# Patient Record
Sex: Female | Born: 1990 | Race: Black or African American | Hispanic: No | Marital: Single | Smoking: Never smoker
Health system: Southern US, Community
[De-identification: ages and names within clinical notes are randomized; demographics above are authoritative.]

## PROBLEM LIST (undated history)

## (undated) DIAGNOSIS — Z87898 Personal history of other specified conditions: Secondary | ICD-10-CM

## (undated) DIAGNOSIS — D649 Anemia, unspecified: Secondary | ICD-10-CM

## (undated) DIAGNOSIS — S0990XA Unspecified injury of head, initial encounter: Secondary | ICD-10-CM

## (undated) HISTORY — PX: FINGER SURGERY: SHX640

## (undated) HISTORY — DX: Anemia, unspecified: D64.9

## (undated) HISTORY — PX: FRACTURE SURGERY: SHX138

## (undated) HISTORY — PX: ACHILLES TENDON SURGERY: SHX542

## (undated) HISTORY — DX: Personal history of other specified conditions: Z87.898

---

## 2012-01-02 ENCOUNTER — Encounter (INDEPENDENT_AMBULATORY_CARE_PROVIDER_SITE_OTHER): Payer: 59 | Admitting: Obstetrics and Gynecology

## 2012-01-02 DIAGNOSIS — Z01419 Encounter for gynecological examination (general) (routine) without abnormal findings: Secondary | ICD-10-CM

## 2012-01-02 DIAGNOSIS — Z202 Contact with and (suspected) exposure to infections with a predominantly sexual mode of transmission: Secondary | ICD-10-CM

## 2012-01-02 DIAGNOSIS — R5383 Other fatigue: Secondary | ICD-10-CM

## 2012-01-29 ENCOUNTER — Encounter: Payer: Self-pay | Admitting: Obstetrics and Gynecology

## 2012-01-29 ENCOUNTER — Ambulatory Visit (INDEPENDENT_AMBULATORY_CARE_PROVIDER_SITE_OTHER): Payer: Self-pay | Admitting: Obstetrics and Gynecology

## 2012-01-29 VITALS — BP 102/70 | HR 82 | Wt 182.0 lb

## 2012-01-29 DIAGNOSIS — R5383 Other fatigue: Secondary | ICD-10-CM | POA: Insufficient documentation

## 2012-01-29 DIAGNOSIS — A749 Chlamydial infection, unspecified: Secondary | ICD-10-CM | POA: Insufficient documentation

## 2012-01-29 DIAGNOSIS — Z202 Contact with and (suspected) exposure to infections with a predominantly sexual mode of transmission: Secondary | ICD-10-CM

## 2012-01-29 DIAGNOSIS — Z113 Encounter for screening for infections with a predominantly sexual mode of transmission: Secondary | ICD-10-CM

## 2012-01-29 DIAGNOSIS — A7489 Other chlamydial diseases: Secondary | ICD-10-CM

## 2012-01-29 DIAGNOSIS — D649 Anemia, unspecified: Secondary | ICD-10-CM | POA: Insufficient documentation

## 2012-01-29 NOTE — Patient Instructions (Signed)
Patient to be given a note for school
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
## 2012-01-30 LAB — GC/CHLAMYDIA PROBE AMP, GENITAL
Chlamydia, DNA Probe: NEGATIVE
GC Probe Amp, Genital: NEGATIVE

## 2012-08-09 ENCOUNTER — Encounter (HOSPITAL_COMMUNITY): Payer: Self-pay | Admitting: Emergency Medicine

## 2012-08-09 ENCOUNTER — Emergency Department (HOSPITAL_COMMUNITY)
Admission: EM | Admit: 2012-08-09 | Discharge: 2012-08-09 | Disposition: A | Discharge status: Home / Self Care | Payer: BC Managed Care – PPO | Source: Home / Self Care | Attending: Family Medicine | Admitting: Family Medicine

## 2012-08-09 ENCOUNTER — Telehealth (HOSPITAL_COMMUNITY): Payer: Self-pay | Admitting: Family Medicine

## 2012-08-09 DIAGNOSIS — H6092 Unspecified otitis externa, left ear: Secondary | ICD-10-CM

## 2012-08-09 DIAGNOSIS — J329 Chronic sinusitis, unspecified: Secondary | ICD-10-CM

## 2012-08-09 DIAGNOSIS — H60399 Other infective otitis externa, unspecified ear: Secondary | ICD-10-CM

## 2012-08-09 MED ORDER — CIPROFLOXACIN-DEXAMETHASONE 0.3-0.1 % OT SUSP: 4.0000 [drp] | Freq: Two times a day (BID) | OTIC | Status: DC

## 2012-08-09 MED ORDER — AMOXICILLIN 500 MG PO CAPS: 500.0000 mg | ORAL_CAPSULE | Freq: Three times a day (TID) | ORAL | Status: DC

## 2012-08-09 MED ORDER — PREDNISONE 20 MG PO TABS: ORAL_TABLET | ORAL | Status: DC

## 2012-08-09 NOTE — ED Notes (Addendum)
Pt c/o sore throat, productive cough with yellow sputum  And sinus drainage. X 1wk   Pt states on Thursday and Friday she had a fever ranging from 101-102.  Having pain in left ear with drainage. That started yesterday.  Pt states that she has felt some dizziness. Pt denies n/v/d.  Pt has used otc cold meds some relief in with throat pain.

## 2012-08-09 NOTE — ED Notes (Signed)
Pharmacy called stating Ciprodex was $100 with insurance.  Dr Tressia Danas reviewed chart and RX was changed to Cortisporin Orit solution 4 drops left ear three times a day for 10 days

## 2012-08-10 NOTE — ED Notes (Signed)
Dr  Payton Mccallum  Another  Day  Off  From  School

## 2012-08-10 NOTE — ED Notes (Signed)
Pt            Called  Still  Has  Fever   And  Earache   Would  Like    School  Extension   for another  Day

## 2012-08-11 NOTE — ED Provider Notes (Signed)
History     CSN: [id number]  Arrival date & time 08/09/12  0915   First MD Initiated Contact with Patient 08/09/12 [phone]      Chief Complaint  Patient presents with  . URI    pt c/o productive cough yellow sputum, sinus drainage, fever thurs and fri 101-102  . Otalgia    left ear pain and drainage. pain started 10/26    (Consider location/radiation/quality/duration/timing/severity/associated sxs/prior treatment) HPI Comments: 21 y/o female here c/o sore throat, productive cough with yellow sputum and sinus drainage for over 1 week. Has felt warm and had a temp 102 two days ago and dizziness. No fever yesterday o today. Started with drainage and pain in her left ear yesterday. Throat pain improved now. Denies nausea or vomiting, no abdominal pain. No chest pain or shortness of breath.   Past Medical History  Diagnosis Date  . Anemia   . H/O fatigue     Past Surgical History  Procedure Date  . Fracture surgery   . Finger surgery   . Achilles tendon surgery     Family History  Problem Relation Age of Onset  . Diabetes Mother   . Allergy (severe) Mother   . Obesity Mother   . Heart disease Maternal Aunt   . Diabetes Maternal Aunt   . Obesity Maternal Aunt   . Heart disease Maternal Uncle   . Diabetes Maternal Uncle   . Diabetes Maternal Grandmother   . Heart disease Maternal Grandmother   . Obesity Maternal Grandmother   . Diabetes Maternal Grandfather   . Heart disease Maternal Grandfather   . Cancer Maternal Grandfather     STOMACH CANCER    History  Substance Use Topics  . Smoking status: Never Smoker   . Smokeless tobacco: Not on file   Comment: HOOKAH  . Alcohol Use: Yes     SOMETIMES    OB History    Grav Para Term Preterm Abortions TAB SAB Ect Mult Living   0         0      Review of Systems  Constitutional: Positive for fever. Negative for appetite change.  HENT: Positive for ear pain, congestion, sore throat, rhinorrhea, sinus pressure and  ear discharge. Negative for facial swelling, trouble swallowing and neck pain.   Eyes: Negative for discharge and visual disturbance.  Respiratory: Positive for cough. Negative for shortness of breath.   Cardiovascular: Negative for chest pain.  Gastrointestinal: Negative for nausea, vomiting and abdominal pain.  Musculoskeletal: Negative for myalgias and arthralgias.  Skin: Negative for rash.  Neurological: Positive for dizziness. Negative for headaches.  All other systems reviewed and are negative.    Allergies  Mold extract  Home Medications   Current Outpatient Rx  Name Route Sig Dispense Refill  . AMOXICILLIN 500 MG PO CAPS Oral Take 1 capsule (500 mg total) by mouth 3 (three) times daily. 21 capsule 0  . CIPROFLOXACIN-DEXAMETHASONE 0.3-0.1 % OT SUSP Left Ear Place 4 drops into the left ear 2 (two) times daily. 7.5 mL 0  . FLUTICASONE FUROATE 27.5 MCG/SPRAY NA SUSP Nasal Place 2 sprays into the nose daily.    Marland Kitchen LEVOCETIRIZINE DIHYDROCHLORIDE 5 MG PO TABS Oral Take 5 mg by mouth every evening.    Marland Kitchen LOESTRIN 24 FE PO Oral Take by mouth.    Marland Kitchen PREDNISONE 20 MG PO TABS  2 tabs by mouth daily for 5 days 10 tablet 0    BP 128/75  Pulse 86  Temp 98.7 F (37.1 C) (Oral)  Resp 18  SpO2 100%  LMP 07/30/2012  Physical Exam  Nursing note reviewed. Constitutional: She is oriented to person, place, and time. She appears well-developed and well-nourished. No distress.  HENT:  Head: Normocephalic and atraumatic.       Nasal Congestion with erythema and swelling of nasal turbinates, clear rhinorrhea. pharyngeal erythema no exudates. No uvula deviation. No trismus. Left ear canal with mild swelling and white adherent exudates but still patent and able to see membrane. Does not appear perforated. TM's with increased vascular markings and some dullness bilaterally no swelling or bulging.   Cardiovascular: Normal rate, regular rhythm and normal heart sounds.   Pulmonary/Chest: Effort  normal and breath sounds normal. No respiratory distress. She has no wheezes. She has no rales. She exhibits no tenderness.  Neurological: She is alert and oriented to person, place, and time.  Skin: No rash noted.    ED Course  Procedures (including critical care time)   Labs Reviewed  POCT RAPID STREP A ([facility] ONLY)  LAB REPORT - SCANNED   No results found.   1. Sinusitis   2. External otitis of left ear       MDM  Treated with amoxicillin, Ciprodex and prednisone. Red flags that should prompt her return to medical attention discussed with patient and provided in writing.        Sharin Grave, MD 08/11/12 [phone]

## 2013-08-08 ENCOUNTER — Emergency Department (HOSPITAL_COMMUNITY): Payer: No Typology Code available for payment source

## 2013-08-08 ENCOUNTER — Encounter (HOSPITAL_COMMUNITY): Payer: Self-pay | Admitting: Emergency Medicine

## 2013-08-08 ENCOUNTER — Emergency Department (HOSPITAL_COMMUNITY)
Admission: EM | Admit: 2013-08-08 | Discharge: 2013-08-08 | Disposition: A | Discharge status: Home / Self Care | Payer: No Typology Code available for payment source | Attending: Emergency Medicine | Admitting: Emergency Medicine

## 2013-08-08 DIAGNOSIS — M62838 Other muscle spasm: Secondary | ICD-10-CM | POA: Insufficient documentation

## 2013-08-08 DIAGNOSIS — S0990XA Unspecified injury of head, initial encounter: Secondary | ICD-10-CM | POA: Insufficient documentation

## 2013-08-08 DIAGNOSIS — Y9389 Activity, other specified: Secondary | ICD-10-CM | POA: Insufficient documentation

## 2013-08-08 DIAGNOSIS — Z862 Personal history of diseases of the blood and blood-forming organs and certain disorders involving the immune mechanism: Secondary | ICD-10-CM | POA: Insufficient documentation

## 2013-08-08 DIAGNOSIS — Y92410 Unspecified street and highway as the place of occurrence of the external cause: Secondary | ICD-10-CM | POA: Insufficient documentation

## 2013-08-08 MED ORDER — METHOCARBAMOL 500 MG PO TABS: 500.0000 mg | ORAL_TABLET | Freq: Two times a day (BID) | ORAL | Status: DC

## 2013-08-08 MED ORDER — METHOCARBAMOL 500 MG PO TABS
500.0000 mg | ORAL_TABLET | Freq: Three times a day (TID) | ORAL | Status: DC
  Administered 2013-08-08: 500 mg via ORAL
  Filled 2013-08-08: qty 1

## 2013-08-08 MED ORDER — HYDROCODONE-ACETAMINOPHEN 5-325 MG PO TABS: 2.0000 | ORAL_TABLET | ORAL | Status: DC | PRN

## 2013-08-08 MED ORDER — HYDROCODONE-ACETAMINOPHEN 5-325 MG PO TABS
2.0000 | ORAL_TABLET | Freq: Once | ORAL | Status: AC
  Administered 2013-08-08: 2 via ORAL
  Filled 2013-08-08: qty 2

## 2013-08-08 NOTE — ED Notes (Signed)
Pt says c color.
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
## 2013-08-10 ENCOUNTER — Emergency Department (HOSPITAL_COMMUNITY): Payer: No Typology Code available for payment source

## 2013-08-10 ENCOUNTER — Encounter (HOSPITAL_COMMUNITY): Payer: Self-pay | Admitting: Emergency Medicine

## 2013-08-10 ENCOUNTER — Emergency Department (INDEPENDENT_AMBULATORY_CARE_PROVIDER_SITE_OTHER)
Admission: EM | Admit: 2013-08-10 | Discharge: 2013-08-10 | Disposition: A | Discharge status: Nursing Home (Includes ICF) | Payer: Self-pay | Source: Home / Self Care | Attending: Family Medicine | Admitting: Family Medicine

## 2013-08-10 DIAGNOSIS — S060X0A Concussion without loss of consciousness, initial encounter: Secondary | ICD-10-CM

## 2013-08-10 DIAGNOSIS — Z79899 Other long term (current) drug therapy: Secondary | ICD-10-CM | POA: Insufficient documentation

## 2013-08-10 DIAGNOSIS — Z3202 Encounter for pregnancy test, result negative: Secondary | ICD-10-CM | POA: Insufficient documentation

## 2013-08-10 DIAGNOSIS — R55 Syncope and collapse: Secondary | ICD-10-CM | POA: Insufficient documentation

## 2013-08-10 DIAGNOSIS — Z862 Personal history of diseases of the blood and blood-forming organs and certain disorders involving the immune mechanism: Secondary | ICD-10-CM | POA: Insufficient documentation

## 2013-08-10 DIAGNOSIS — R110 Nausea: Secondary | ICD-10-CM | POA: Insufficient documentation

## 2013-08-10 DIAGNOSIS — H538 Other visual disturbances: Secondary | ICD-10-CM | POA: Insufficient documentation

## 2013-08-10 DIAGNOSIS — Z87820 Personal history of traumatic brain injury: Secondary | ICD-10-CM | POA: Insufficient documentation

## 2013-08-10 DIAGNOSIS — R404 Transient alteration of awareness: Secondary | ICD-10-CM

## 2013-08-10 DIAGNOSIS — Z8679 Personal history of other diseases of the circulatory system: Secondary | ICD-10-CM | POA: Insufficient documentation

## 2013-08-10 DIAGNOSIS — R4020 Unspecified coma: Secondary | ICD-10-CM

## 2013-08-10 DIAGNOSIS — H53149 Visual discomfort, unspecified: Secondary | ICD-10-CM | POA: Insufficient documentation

## 2013-08-10 DIAGNOSIS — R51 Headache: Secondary | ICD-10-CM | POA: Insufficient documentation

## 2013-08-10 HISTORY — DX: Unspecified injury of head, initial encounter: S09.90XA

## 2013-08-10 LAB — POCT PREGNANCY, URINE: Preg Test, Ur: NEGATIVE
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
## 2013-08-11 ENCOUNTER — Emergency Department (HOSPITAL_COMMUNITY)
Admission: EM | Admit: 2013-08-11 | Discharge: 2013-08-11 | Disposition: A | Discharge status: Home / Self Care | Payer: No Typology Code available for payment source | Attending: Emergency Medicine | Admitting: Emergency Medicine

## 2013-08-11 ENCOUNTER — Emergency Department (HOSPITAL_COMMUNITY): Payer: No Typology Code available for payment source

## 2013-08-11 DIAGNOSIS — R55 Syncope and collapse: Secondary | ICD-10-CM

## 2013-08-11 LAB — CBC WITH DIFFERENTIAL/PLATELET
Basophils Relative: 1 % (ref 0–1)
Eosinophils Absolute: 0.1 10*3/uL (ref 0.0–0.7)
Eosinophils Relative: 2 % (ref 0–5)
Hemoglobin: 14.2 g/dL (ref 12.0–15.0)
Lymphocytes Relative: 46 % (ref 12–46)
Lymphs Abs: 3.1 10*3/uL (ref 0.7–4.0)
MCV: 84.5 fL (ref 78.0–100.0)
Monocytes Absolute: 0.4 10*3/uL (ref 0.1–1.0)
Monocytes Relative: 6 % (ref 3–12)
Neutro Abs: 3 10*3/uL (ref 1.7–7.7)
Neutrophils Relative %: 45 % (ref 43–77)
Platelets: 340 10*3/uL (ref 150–400)
RBC: 4.59 MIL/uL (ref 3.87–5.11)
WBC: 6.6 10*3/uL (ref 4.0–10.5)

## 2013-08-11 LAB — BASIC METABOLIC PANEL
CO2: 23 mEq/L (ref 19–32)
Calcium: 9.1 mg/dL (ref 8.4–10.5)
Chloride: 103 mEq/L (ref 96–112)
GFR calc non Af Amer: >90 mL/min (ref >90)
Glucose, Bld: 111 mg/dL — ABNORMAL HIGH (ref 70–99)
Potassium: 3.5 mEq/L (ref 3.5–5.1)
Sodium: 139 mEq/L (ref 135–145)

## 2013-08-11 LAB — URINALYSIS, ROUTINE W REFLEX MICROSCOPIC
Bilirubin Urine: NEGATIVE
Glucose, UA: NEGATIVE mg/dL
Ketones, ur: NEGATIVE mg/dL
Nitrite: NEGATIVE
Specific Gravity, Urine: 1.029 (ref 1.005–1.030)
pH: 6 (ref 5.0–8.0)

## 2013-08-11 LAB — URINE MICROSCOPIC-ADD ON

## 2013-08-11 LAB — RAPID URINE DRUG SCREEN, HOSP PERFORMED
Amphetamines: NOT DETECTED
Barbiturates: NOT DETECTED
Benzodiazepines: NOT DETECTED
Cocaine: NOT DETECTED

## 2013-08-11 MED ORDER — IBUPROFEN 800 MG PO TABS: 800.0000 mg | ORAL_TABLET | Freq: Three times a day (TID) | ORAL | Status: DC

## 2013-08-11 MED ORDER — KETOROLAC TROMETHAMINE 60 MG/2ML IM SOLN
60.0000 mg | Freq: Once | INTRAMUSCULAR | Status: AC
  Administered 2013-08-11: 60 mg via INTRAMUSCULAR
  Filled 2013-08-11: qty 2

## 2013-08-11 NOTE — ED Notes (Signed)
No changes, pt alert, NAD, calm, interactive, speech clear.

## 2013-08-11 NOTE — ED Notes (Signed)
Pt/family updated, plan explained.

## 2013-08-11 NOTE — ED Notes (Signed)
Pt resting/ sleeping, NAD, calm.  

## 2013-08-11 NOTE — ED Notes (Addendum)
Family has left BS, no changes, pt resting/ sleeping. Pt to have MRI in am.

## 2013-08-11 NOTE — ED Notes (Signed)
Pt reports HA went away completely after med at [facility], gradually returned. When asked about the last syncopal episode she stated, "she had one at [facility], had one in triage here and had one in w/r". When asked ho long they last, parent states, "we have to wake her up". Pt states, "they don't let me sleep". Reports sensitivity to light.
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
## 2013-08-11 NOTE — ED Notes (Signed)
PA at bedside.

## 2013-08-11 NOTE — ED Provider Notes (Signed)
Medical screening examination/treatment/procedure(s) were performed by non-physician practitioner and as supervising physician I was immediately available for consultation/collaboration.  EKG Interpretation   None        Geoffery Lyons, MD 08/11/13 [phone]

## 2013-08-11 NOTE — ED Notes (Signed)
ED PA at [facility] 

## 2013-08-11 NOTE — ED Notes (Signed)
Pt alert, NAD, calm, interactive, resps e/u, speaking in clear complete sentences, steady gait, ambulatory back to room, family x2 at [facility], reports med given at [facility] for HA is wearing off, HA returning.

## 2013-08-11 NOTE — ED Notes (Signed)
Pt arousable to entering room, alert, NAD, calm, VSS, pt to MRI.

## 2013-08-11 NOTE — ED Notes (Signed)
Pt back from MRI, alert, NAD, calm, interactive, no changes, VSS. pending results.

## 2013-08-12 ENCOUNTER — Encounter: Payer: Self-pay | Admitting: Neurology

## 2013-08-12 ENCOUNTER — Ambulatory Visit (INDEPENDENT_AMBULATORY_CARE_PROVIDER_SITE_OTHER): Payer: No Typology Code available for payment source | Admitting: Neurology

## 2013-08-12 VITALS — BP 117/68 | HR 85 | Temp 98.5°F | Ht 66.0 in | Wt 197.0 lb

## 2013-08-12 DIAGNOSIS — R404 Transient alteration of awareness: Secondary | ICD-10-CM

## 2013-08-12 DIAGNOSIS — R400 Somnolence: Secondary | ICD-10-CM

## 2013-08-12 DIAGNOSIS — R6889 Other general symptoms and signs: Secondary | ICD-10-CM

## 2013-08-12 NOTE — ED Provider Notes (Signed)
Medical screening examination/treatment/procedure(s) were performed by non-physician practitioner and as supervising physician I was immediately available for consultation/collaboration.    Vida Roller, MD 08/12/13 [phone]

## 2013-08-12 NOTE — Patient Instructions (Addendum)
Please remember, common headache triggers are: sleep deprivation, dehydration, overheating, stress, hypoglycemia or skipping meals and blood sugar fluctuations, excessive pain medications or excessive alcohol use or caffeine withdrawal. Some people have food triggers such as aged cheese, orange juice or chocolate, especially dark chocolate, or MSG (monosodium glutamate). Try to avoid these headache triggers as much possible. It may be helpful to keep a headache diary to figure out what makes your headaches worse or brings them on and what alleviates them. Some people report headache onset after exercise but studies have shown that regular exercise may actually prevent headaches from coming. If you have exercise-induced headaches, please make sure that you drink plenty of fluid before and after exercising and that you do not over do it and do not overheat.  Reduce and stop robaxin and reduce Ibuprofen, try not to take pain killers daily. Stop all nicotine and illicit drug products.   We will do further testing with EEG, sleep studies. No new medications.
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
## 2013-08-13 LAB — VITAMIN D 25 HYDROXY (VIT D DEFICIENCY, FRACTURES): Vit D, 25-Hydroxy: 12.5 ng/mL — ABNORMAL LOW (ref 30.0–100.0)

## 2013-08-13 LAB — FERRITIN: Ferritin: 111 ng/mL (ref 15–150)

## 2013-08-13 LAB — TSH: TSH: 0.474 u[IU]/mL (ref 0.450–4.500)

## 2013-08-16 ENCOUNTER — Ambulatory Visit (INDEPENDENT_AMBULATORY_CARE_PROVIDER_SITE_OTHER): Payer: No Typology Code available for payment source | Admitting: Radiology

## 2013-08-16 DIAGNOSIS — R6889 Other general symptoms and signs: Secondary | ICD-10-CM

## 2013-08-16 DIAGNOSIS — R400 Somnolence: Secondary | ICD-10-CM

## 2013-08-16 NOTE — Progress Notes (Signed)
Quick Note:  Please call and advise the patient that the recent labs we checked were within normal limits, with the exception of low vitamin D level, for which I would recommend that she start an over-the-counter vitamin D supplement. She can also discuss with her primary care physician vitamin D supplementation by prescription strength. Otherwise, she had a normal vitamin B12 level, normal iron, normal thyroid function, and we also checked and autoimmune marker which was negative. No other action is required on these tests at this time, other than Vit D supplementation. Please remind patient to keep any upcoming appointments or tests and to call us with any interim questions, concerns, problems or updates. Thanks,  Huston Foley, MD, PhD    ______

## 2013-08-17 NOTE — Progress Notes (Signed)
Quick Note:  Called ,no answer,left VM message for return call back for lab results ______

## 2013-08-17 NOTE — Progress Notes (Signed)
Quick Note:  I called and relayed the results of her labs (normal) except low vit D level. She does not have pcp at this time. She asked if she could call back re: to this. I said sure. ______

## 2013-08-19 ENCOUNTER — Ambulatory Visit (INDEPENDENT_AMBULATORY_CARE_PROVIDER_SITE_OTHER): Payer: No Typology Code available for payment source

## 2013-08-19 DIAGNOSIS — G4710 Hypersomnia, unspecified: Secondary | ICD-10-CM

## 2013-08-19 DIAGNOSIS — R6889 Other general symptoms and signs: Secondary | ICD-10-CM

## 2013-08-19 DIAGNOSIS — G4733 Obstructive sleep apnea (adult) (pediatric): Secondary | ICD-10-CM

## 2013-08-19 DIAGNOSIS — R400 Somnolence: Secondary | ICD-10-CM

## 2013-08-30 ENCOUNTER — Telehealth: Payer: Self-pay | Admitting: Neurology

## 2013-08-31 NOTE — Telephone Encounter (Signed)
Spoke to patient and explained that sleep study not yet read, but it was moved to top of the list and I expect it should be read tomorrow.  I will call her after it is read to give her those results.  She also asked about EEG which I can't see those results in the system, told her I wouldn't be able to give her any of that information.  She asks for the following: 1.  Letter for the GRE she is scheduled to take.  The letter needs to include her confirmation number which is #[id number], it needs to state that she is unable to take the GRE at this time due to concussion symptoms and the inability to concentrate.  This letter needs to be faxed to [phone].  She requests a phone call once it has been faxed so she knows this has gone through.  2.  Letter for school ASAP stating that she is cleared to attend classes, it needs to list her health status and mention her ability to focus in class.  She can pick this up if she is cleared to drive, otherwise, she will need to have it mailed to her.  3.  She also wonders if she can drive?

## 2013-08-31 NOTE — Telephone Encounter (Signed)
Please advise 

## 2013-09-01 ENCOUNTER — Telehealth: Payer: Self-pay | Admitting: Neurology

## 2013-09-01 ENCOUNTER — Telehealth: Payer: Self-pay | Admitting: *Deleted

## 2013-09-01 ENCOUNTER — Encounter: Payer: Self-pay | Admitting: *Deleted

## 2013-09-01 DIAGNOSIS — G4733 Obstructive sleep apnea (adult) (pediatric): Secondary | ICD-10-CM

## 2013-09-01 NOTE — Telephone Encounter (Signed)
Please call and notify the patient that the recent sleep study did confirm the diagnosis of obstructive sleep apnea and that I recommend treatment for this in the form of CPAP. This will require a repeat sleep study for proper titration and mask fitting. Please explain to patient and arrange for a CPAP titration study. I have placed an order in the chart. Thanks, Theo Krumholz, MD, PhD [facility] ([facility]  

## 2013-09-01 NOTE — Telephone Encounter (Signed)
No evidence of narcolepsy, but EEG has not been interpreted yet. I would like to hold off on driving until her EEG is reported. I will try to expedite. At this point, please ask patient to let someone drive her. thx

## 2013-09-01 NOTE — Telephone Encounter (Signed)
Spoke with patient and she said that Dr Frances Furbish is suppose to give her clearance to drive because of narcolepsy, ER doctor told her that she could not drive.

## 2013-09-01 NOTE — Telephone Encounter (Signed)
I called and spoke with the patient concerning her sleep study results. I informed the patient that her study revealed the diagnosis of obstructive sleep apnea and Dr. Frances Furbish is recommending CPAP Titration study. Patient stated she wants to hold off on the study because she knows its her tonsils that is causing the obstructive sleep and she wants to deal with her headaches.   I spoke with Dr. Frances Furbish about the patient concerning and she stated to schedule a follow visit. Patient is schedule for 09-02-13

## 2013-09-01 NOTE — Telephone Encounter (Signed)
Pt was contacted 09/01/13 by Jasmine December with sleep study results.  See separate telephone encounter for additional details.

## 2013-09-02 ENCOUNTER — Encounter: Payer: Self-pay | Admitting: Neurology

## 2013-09-02 ENCOUNTER — Ambulatory Visit (INDEPENDENT_AMBULATORY_CARE_PROVIDER_SITE_OTHER): Payer: No Typology Code available for payment source | Admitting: Neurology

## 2013-09-02 VITALS — BP 130/80 | HR 86 | Ht 66.0 in | Wt 198.0 lb

## 2013-09-02 DIAGNOSIS — R930 Abnormal findings on diagnostic imaging of skull and head, not elsewhere classified: Secondary | ICD-10-CM

## 2013-09-02 DIAGNOSIS — R51 Headache: Secondary | ICD-10-CM

## 2013-09-02 DIAGNOSIS — G4733 Obstructive sleep apnea (adult) (pediatric): Secondary | ICD-10-CM

## 2013-09-02 DIAGNOSIS — R9089 Other abnormal findings on diagnostic imaging of central nervous system: Secondary | ICD-10-CM

## 2013-09-02 DIAGNOSIS — Z87820 Personal history of traumatic brain injury: Secondary | ICD-10-CM

## 2013-09-02 NOTE — Telephone Encounter (Signed)
Spoke with patient and shared information below concerning not being able to drive as of yet per Dr Romie Levee understood.
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
## 2013-09-02 NOTE — Patient Instructions (Signed)
You may return full time.  You can follow up with me on an as needed basis.  You can drive without restriction. Do not drive after taking sedating medications and do not drive if you feel sleepy.

## 2013-09-03 NOTE — Procedures (Signed)
    History:  Joy Kelly is a 22 year old patient with a history of syncopal events, or episodes of decreased attentiveness. The patient has a history of anemia and migraine headaches as well. The patient had an episode of loss of consciousness on 08/10/2013. The patient is being evaluated for this episode.  This is a routine EEG. No skull defects are noted. Medications include hydrocodone, ibuprofen, Robaxin, and birth control pills.   EEG classification: Normal awake and asleep  Description of the recording: The background rhythms of this recording consists of a fairly well modulated medium amplitude background activity of 9 Hz. As the record progresses, the patient initially is in the waking state, but appears to enter the early stage II sleep during the recording, with rudimentary sleep spindles and vertex sharp wave activity seen. During the wakeful state, photic stimulation is performed, and this results in a bilateral and symmetric photic driving response. Hyperventilation was also performed, and this results in a minimal buildup of the background rhythm activities without significant slowing seen. At no time during the recording does there appear to be evidence of spike or spike wave discharges or evidence of focal slowing. EKG monitor shows no evidence of cardiac rhythm abnormalities with a heart rate of 66.  Impression: This is a normal EEG recording in the waking and sleeping state. No evidence of ictal or interictal discharges were seen at any time during the recording.

## 2013-09-06 NOTE — Progress Notes (Signed)
Quick Note:  Please call and advise the patient that the EEG or brain wave test we performed was reported as normal in the awake and sleep states. We checked for abnormal electrical discharges in the brain waves and the report suggested normal findings. No further action is required on this test at this time.  Huston Foley, MD, PhD    ______

## 2013-11-09 ENCOUNTER — Emergency Department (HOSPITAL_COMMUNITY)
Admission: EM | Admit: 2013-11-09 | Discharge: 2013-11-09 | Disposition: A | Discharge status: Home / Self Care | Payer: Self-pay | Attending: Emergency Medicine | Admitting: Emergency Medicine

## 2013-11-09 ENCOUNTER — Encounter (HOSPITAL_COMMUNITY): Payer: Self-pay | Admitting: Emergency Medicine

## 2013-11-09 DIAGNOSIS — G43909 Migraine, unspecified, not intractable, without status migrainosus: Secondary | ICD-10-CM | POA: Insufficient documentation

## 2013-11-09 DIAGNOSIS — R519 Headache, unspecified: Secondary | ICD-10-CM

## 2013-11-09 DIAGNOSIS — R42 Dizziness and giddiness: Secondary | ICD-10-CM | POA: Insufficient documentation

## 2013-11-09 DIAGNOSIS — R55 Syncope and collapse: Secondary | ICD-10-CM | POA: Insufficient documentation

## 2013-11-09 DIAGNOSIS — H53149 Visual discomfort, unspecified: Secondary | ICD-10-CM | POA: Insufficient documentation

## 2013-11-09 DIAGNOSIS — D649 Anemia, unspecified: Secondary | ICD-10-CM | POA: Insufficient documentation

## 2013-11-09 DIAGNOSIS — Z87828 Personal history of other (healed) physical injury and trauma: Secondary | ICD-10-CM | POA: Insufficient documentation

## 2013-11-09 DIAGNOSIS — H538 Other visual disturbances: Secondary | ICD-10-CM | POA: Insufficient documentation

## 2013-11-09 DIAGNOSIS — Z79899 Other long term (current) drug therapy: Secondary | ICD-10-CM | POA: Insufficient documentation

## 2013-11-09 DIAGNOSIS — R51 Headache: Secondary | ICD-10-CM

## 2013-11-09 LAB — BASIC METABOLIC PANEL
BUN: 8 mg/dL (ref 6–23)
CO2: 23 meq/L (ref 19–32)
Calcium: 8.9 mg/dL (ref 8.4–10.5)
Chloride: 104 mEq/L (ref 96–112)
Creatinine, Ser: 0.81 mg/dL (ref 0.50–1.10)
GFR calc Af Amer: >90 mL/min (ref >90)
GFR calc non Af Amer: >90 mL/min (ref >90)
GLUCOSE: 111 mg/dL — AB (ref 70–99)
POTASSIUM: 4 meq/L (ref 3.7–5.3)
SODIUM: 141 meq/L (ref 137–147)

## 2013-11-09 LAB — CBC WITH DIFFERENTIAL/PLATELET
BASOS ABS: 0 10*3/uL (ref 0.0–0.1)
Basophils Relative: 0 % (ref 0–1)
Eosinophils Absolute: 0.2 10*3/uL (ref 0.0–0.7)
Eosinophils Relative: 2 % (ref 0–5)
HCT: 39.3 % (ref 36.0–46.0)
Hemoglobin: 14.1 g/dL (ref 12.0–15.0)
LYMPHS ABS: 3.4 10*3/uL (ref 0.7–4.0)
LYMPHS PCT: 46 % (ref 12–46)
MCH: 30.1 pg (ref 26.0–34.0)
MCHC: 35.9 g/dL (ref 30.0–36.0)
MCV: 84 fL (ref 78.0–100.0)
Monocytes Absolute: 0.4 10*3/uL (ref 0.1–1.0)
Monocytes Relative: 5 % (ref 3–12)
NEUTROS ABS: 3.5 10*3/uL (ref 1.7–7.7)
NEUTROS PCT: 47 % (ref 43–77)
PLATELETS: 328 10*3/uL (ref 150–400)
RBC: 4.68 MIL/uL (ref 3.87–5.11)
RDW: 12.4 % (ref 11.5–15.5)
WBC: 7.5 10*3/uL (ref 4.0–10.5)

## 2013-11-09 MED ORDER — METOCLOPRAMIDE HCL 5 MG/ML IJ SOLN
10.0000 mg | Freq: Once | INTRAMUSCULAR | Status: AC
  Administered 2013-11-09: 10 mg via INTRAVENOUS
  Filled 2013-11-09: qty 2

## 2013-11-09 MED ORDER — SODIUM CHLORIDE 0.9 % IV BOLUS (SEPSIS)
1000.0000 mL | Freq: Once | INTRAVENOUS | Status: AC
  Administered 2013-11-09: 1000 mL via INTRAVENOUS

## 2013-11-09 MED ORDER — DIPHENHYDRAMINE HCL 50 MG/ML IJ SOLN
25.0000 mg | Freq: Once | INTRAMUSCULAR | Status: AC
  Administered 2013-11-09: 25 mg via INTRAVENOUS
  Filled 2013-11-09: qty 1
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
## 2013-11-09 NOTE — ED Notes (Signed)
Pt comfortable, NAD at this time.

## 2013-11-09 NOTE — Discharge Instructions (Signed)
You again have been given a referral to a neurosurgeon.  Please try to arrange followup evaluation Today, your headache was treated with IV fluids, Benadryl, and Reglan.
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
## 2013-11-10 NOTE — ED Provider Notes (Signed)
  Medical screening examination/treatment/procedure(s) were performed by non-physician practitioner and as supervising physician I was immediately available for consultation/collaboration.      Shail Urbas, MD 11/10/13 1636 

## 2013-12-28 ENCOUNTER — Ambulatory Visit: Payer: No Typology Code available for payment source | Admitting: Neurology

## 2014-07-06 ENCOUNTER — Emergency Department (HOSPITAL_COMMUNITY)
Admission: EM | Admit: 2014-07-06 | Discharge: 2014-07-07 | Disposition: A | Discharge status: Home / Self Care | Payer: BC Managed Care – PPO | Attending: Emergency Medicine | Admitting: Emergency Medicine

## 2014-07-06 ENCOUNTER — Encounter (HOSPITAL_COMMUNITY): Payer: Self-pay | Admitting: Emergency Medicine

## 2014-07-06 DIAGNOSIS — G43809 Other migraine, not intractable, without status migrainosus: Secondary | ICD-10-CM | POA: Diagnosis not present

## 2014-07-06 DIAGNOSIS — R51 Headache: Secondary | ICD-10-CM | POA: Insufficient documentation

## 2014-07-06 DIAGNOSIS — Z862 Personal history of diseases of the blood and blood-forming organs and certain disorders involving the immune mechanism: Secondary | ICD-10-CM | POA: Insufficient documentation

## 2014-07-06 DIAGNOSIS — Z87828 Personal history of other (healed) physical injury and trauma: Secondary | ICD-10-CM | POA: Insufficient documentation

## 2014-07-06 NOTE — ED Notes (Signed)
Pt states she is here for headache Pt states the headache started 4 days ago  Pt states she had one before that that lasted 2 days  Pt states she has taken ibuprofen without relief  Pt states she has had blurred vision with the headaches  Pt c/o nausea with the headaches as well  Pt has hx of migraines

## 2014-07-07 ENCOUNTER — Telehealth: Payer: Self-pay | Admitting: Neurology

## 2014-07-07 MED ORDER — SUMATRIPTAN SUCCINATE 100 MG PO TABS: 100.0000 mg | ORAL_TABLET | ORAL | Status: DC | PRN

## 2014-07-07 MED ORDER — NAPROXEN 500 MG PO TABS: 500.0000 mg | ORAL_TABLET | Freq: Two times a day (BID) | ORAL | Status: DC

## 2014-07-07 MED ORDER — DIPHENHYDRAMINE HCL 50 MG/ML IJ SOLN
25.0000 mg | Freq: Once | INTRAMUSCULAR | Status: AC
  Administered 2014-07-07: 25 mg via INTRAMUSCULAR
  Filled 2014-07-07: qty 1

## 2014-07-07 MED ORDER — METOCLOPRAMIDE HCL 5 MG/ML IJ SOLN
10.0000 mg | Freq: Once | INTRAMUSCULAR | Status: AC
  Administered 2014-07-07: 10 mg via INTRAMUSCULAR
  Filled 2014-07-07: qty 2

## 2014-07-07 MED ORDER — KETOROLAC TROMETHAMINE 60 MG/2ML IM SOLN
60.0000 mg | Freq: Once | INTRAMUSCULAR | Status: AC
  Administered 2014-07-07: 60 mg via INTRAMUSCULAR
  Filled 2014-07-07: qty 2

## 2014-07-07 NOTE — ED Provider Notes (Signed)
Medical screening examination/treatment/procedure(s) were performed by non-physician practitioner and as supervising physician I was immediately available for consultation/collaboration.   EKG Interpretation None        Daria Mcmeekin, MD 07/07/14 0719 

## 2014-07-07 NOTE — Discharge Instructions (Signed)
Take naproxen for your headache as prescribed. He can also try Imitrex at the first onset of a headache. Followup with your neurologist   Migraine Headache A migraine headache is an intense, throbbing pain on one or both sides of your head. A migraine can last for 30 minutes to several hours. CAUSES  The exact cause of a migraine headache is not always known. However, a migraine may be caused when nerves in the brain become irritated and release chemicals that cause inflammation. This causes pain. Certain things may also trigger migraines, such as:  Alcohol.  Smoking.  Stress.  Menstruation.  Aged cheeses.  Foods or drinks that contain nitrates, glutamate, aspartame, or tyramine.  Lack of sleep.  Chocolate.  Caffeine.  Hunger.  Physical exertion.  Fatigue.  Medicines used to treat chest pain (nitroglycerine), birth control pills, estrogen, and some blood pressure medicines. SIGNS AND SYMPTOMS  Pain on one or both sides of your head.  Pulsating or throbbing pain.  Severe pain that prevents daily activities.  Pain that is aggravated by any physical activity.  Nausea, vomiting, or both.  Dizziness.  Pain with exposure to bright lights, loud noises, or activity.  General sensitivity to bright lights, loud noises, or smells. Before you get a migraine, you may get warning signs that a migraine is coming (aura). An aura may include:  Seeing flashing lights.  Seeing bright spots, halos, or zigzag lines.  Having tunnel vision or blurred vision.  Having feelings of numbness or tingling.  Having trouble talking.  Having muscle weakness. DIAGNOSIS  A migraine headache is often diagnosed based on:  Symptoms.  Physical exam.  A CT scan or MRI of your head. These imaging tests cannot diagnose migraines, but they can help rule out other causes of headaches. TREATMENT Medicines may be given for pain and nausea. Medicines can also be given to help prevent  recurrent migraines.  HOME CARE INSTRUCTIONS  Only take over-the-counter or prescription medicines for pain or discomfort as directed by your health care provider. The use of long-term narcotics is not recommended.  Lie down in a dark, quiet room when you have a migraine.  Keep a journal to find out what may trigger your migraine headaches. For example, write down:  What you eat and drink.  How much sleep you get.  Any change to your diet or medicines.  Limit alcohol consumption.  Quit smoking if you smoke.  Get 7-9 hours of sleep, or as recommended by your health care provider.  Limit stress.  Keep lights dim if bright lights bother you and make your migraines worse. SEEK IMMEDIATE MEDICAL CARE IF:   Your migraine becomes severe.  You have a fever.  You have a stiff neck.  You have vision loss.  You have muscular weakness or loss of muscle control.  You start losing your balance or have trouble walking.  You feel faint or pass out.  You have severe symptoms that are different from your first symptoms. MAKE SURE YOU:   Understand these instructions.  Will watch your condition.  Will get help right away if you are not doing well or get worse. Document Released: 09/30/2005 Document Revised: 02/14/2014 Document Reviewed: 06/07/2013 [facility] Patient Information 2015 [place], [place]. This information is not intended to replace advice given to you by your health care provider. Make sure you discuss any questions you have with your health care provider.

## 2014-07-07 NOTE — Telephone Encounter (Signed)
Patient would like to make a follow up visit and has paid her balance. She has seen Dr. Frances Furbish in the past but states Dr. Frances Furbish told her she needed to see another neurologist or a neurosurgeon. Best number to call [phone]. It is okay to leave a detailed message.
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
## 2014-07-12 NOTE — Telephone Encounter (Signed)
Called and scheduled appt. Confirmed with patient

## 2014-07-14 ENCOUNTER — Telehealth: Payer: Self-pay | Admitting: *Deleted

## 2014-07-14 NOTE — Telephone Encounter (Signed)
Patient calling to state that her headaches and concentration has become worse for the past month, was seen at the hospital last week, patient is requesting an MRI or CT scan to see if her cyst has grown, please advise

## 2014-07-14 NOTE — Telephone Encounter (Signed)
I have not seen her for headaches before. Okay to make appt, which I see is pending for the 10th of this month

## 2014-07-15 NOTE — Telephone Encounter (Signed)
Joy Kelly, can you please schedule this patient for an appointment

## 2014-07-18 NOTE — Telephone Encounter (Signed)
Appointment has been made for patient 

## 2014-07-25 ENCOUNTER — Encounter: Payer: Self-pay | Admitting: Neurology

## 2014-07-25 ENCOUNTER — Ambulatory Visit (INDEPENDENT_AMBULATORY_CARE_PROVIDER_SITE_OTHER): Payer: BC Managed Care – PPO | Admitting: Neurology

## 2014-07-25 VITALS — BP 118/77 | HR 98 | Temp 98.5°F | Resp 14 | Ht 67.75 in | Wt 199.0 lb

## 2014-07-25 DIAGNOSIS — G43109 Migraine with aura, not intractable, without status migrainosus: Secondary | ICD-10-CM

## 2014-07-25 MED ORDER — PROMETHAZINE HCL 12.5 MG PO TABS: 12.5000 mg | ORAL_TABLET | Freq: Four times a day (QID) | ORAL | Status: AC | PRN

## 2014-07-25 MED ORDER — NAPROXEN 500 MG PO TABS: 500.0000 mg | ORAL_TABLET | Freq: Two times a day (BID) | ORAL | Status: AC

## 2014-07-25 MED ORDER — SUMATRIPTAN SUCCINATE 100 MG PO TABS: ORAL_TABLET | ORAL | Status: AC
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
## 2014-08-31 ENCOUNTER — Encounter: Payer: Self-pay | Admitting: Neurology
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
## 2014-10-26 IMAGING — CT CT HEAD W/O CM
1 series · 16 of 30 positions shown, 20 images · non-contrast
Comparison: 08/08/2013

CLINICAL DATA: Head injury with loss of consciousness

EXAM:
CT HEAD WITHOUT CONTRAST
TECHNIQUE: Contiguous axial images were obtained from the base of the skull
through the vertex without intravenous contrast.

[Series 2: head 5.0 h30s · axial · 0.44mm/px · z∈[-83,+57]mm · 16 of 32 slices shown, 20 images]
[im 2/32  brain]
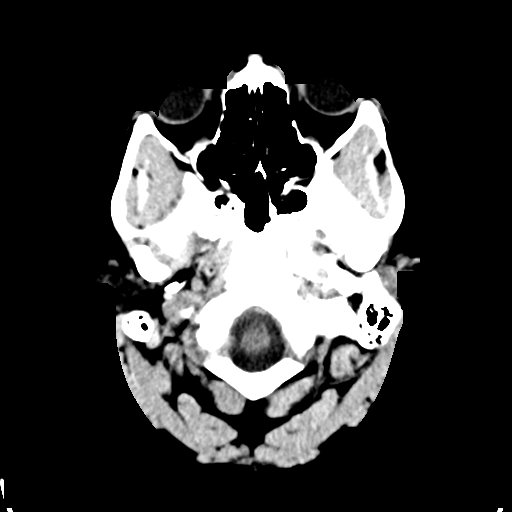
[im 2/32  bone]
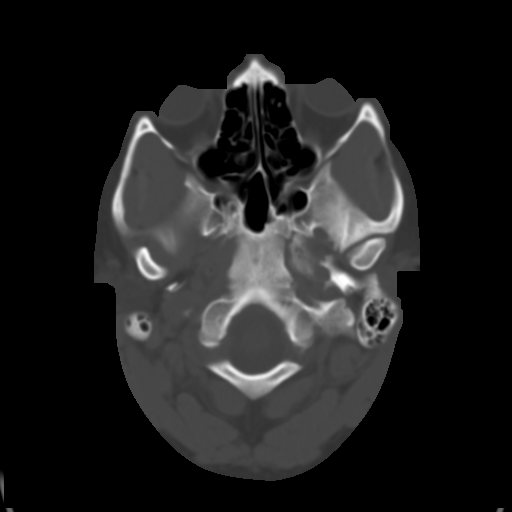
[im 4/32  brain]
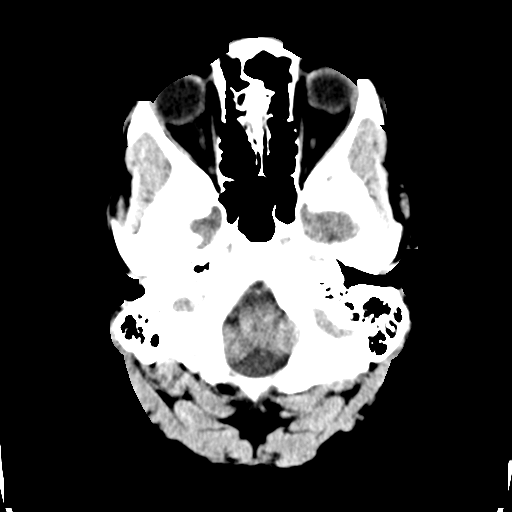
[im 6/32  brain]
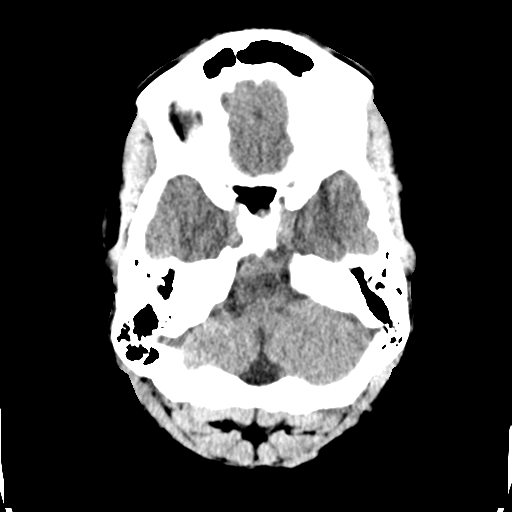
[im 8/32  brain]
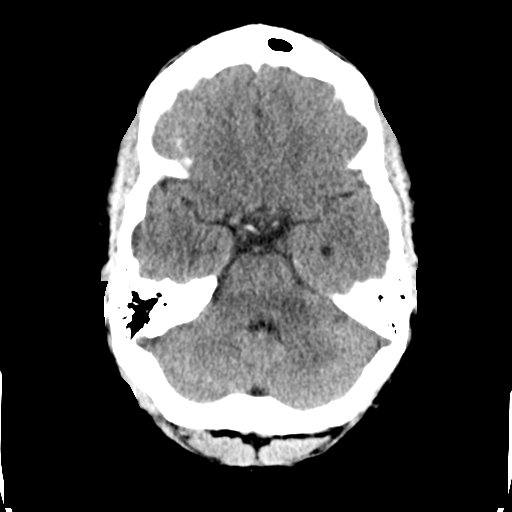
[im 9/32  brain]
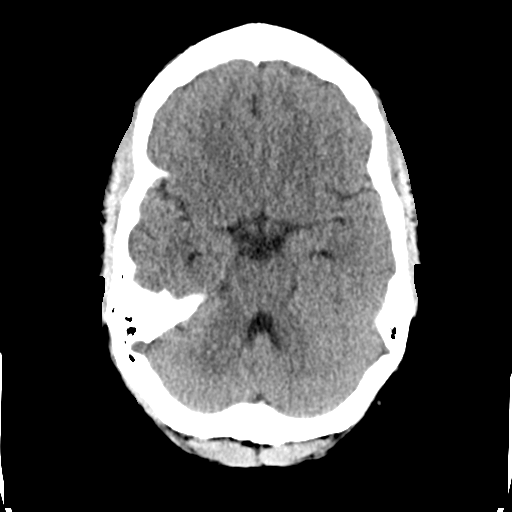
[im 9/32  bone]
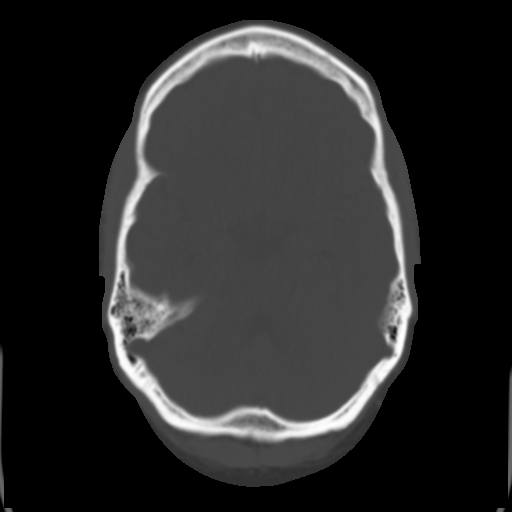
[im 11/32  brain]
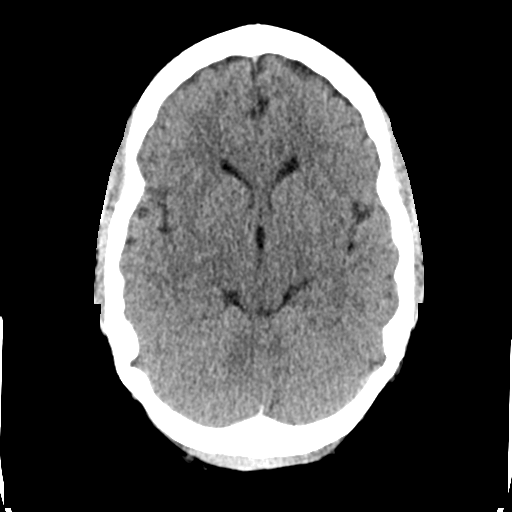
[im 13/32  brain]
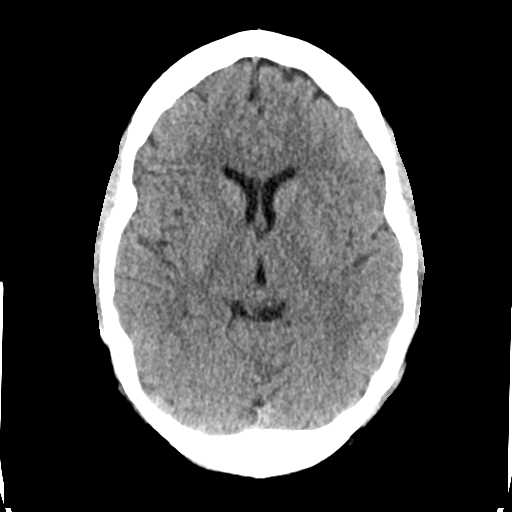
[im 15/32  brain]
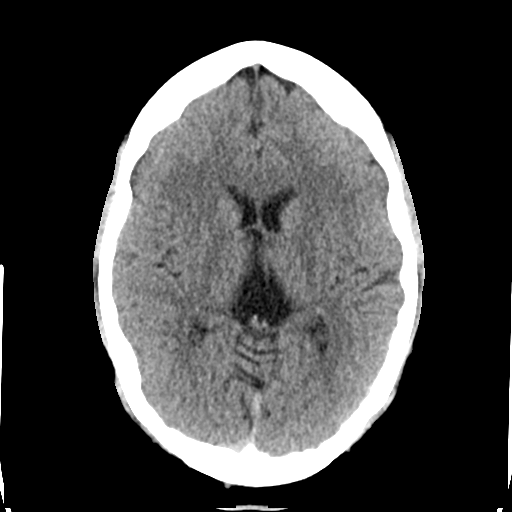
[im 17/32  brain]
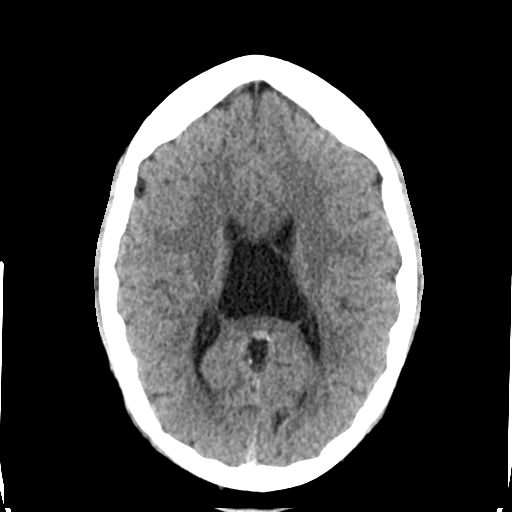
[im 17/32  bone]
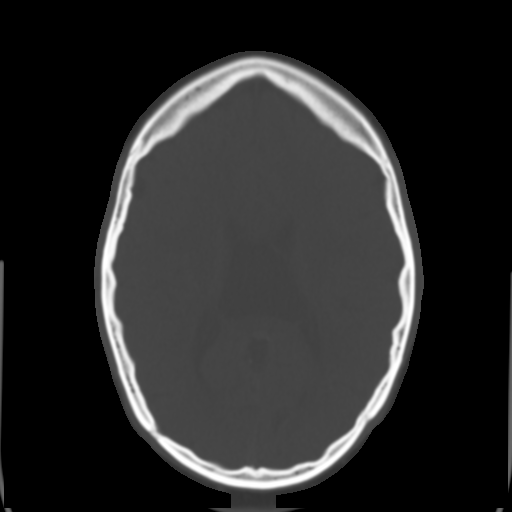
[im 19/32  brain]
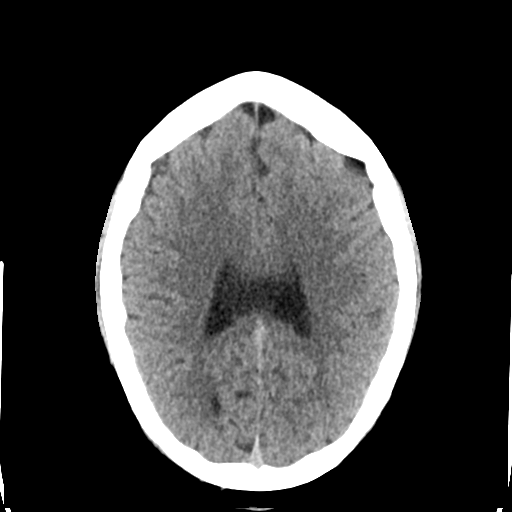
[im 21/32  brain]
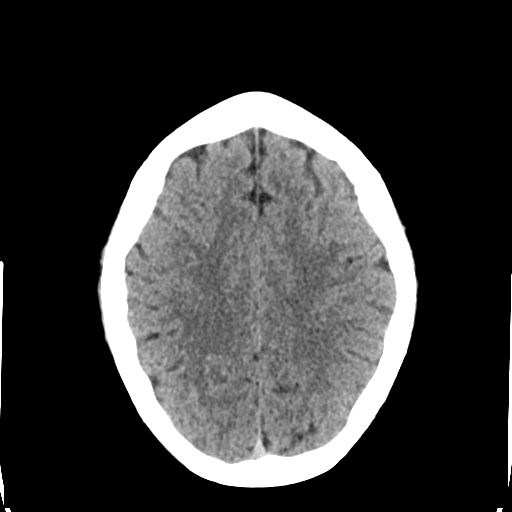
[im 23/32  brain]
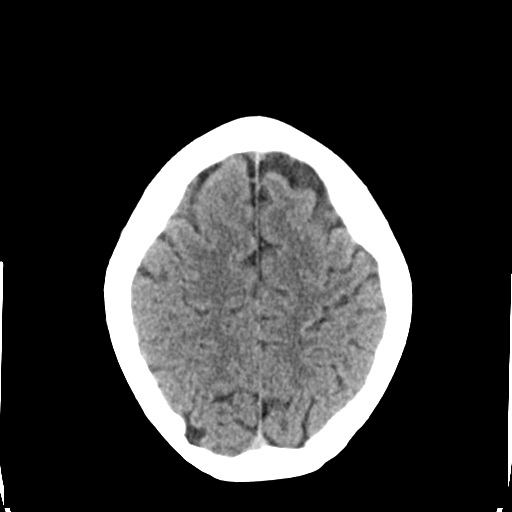
[im 24/32  brain]
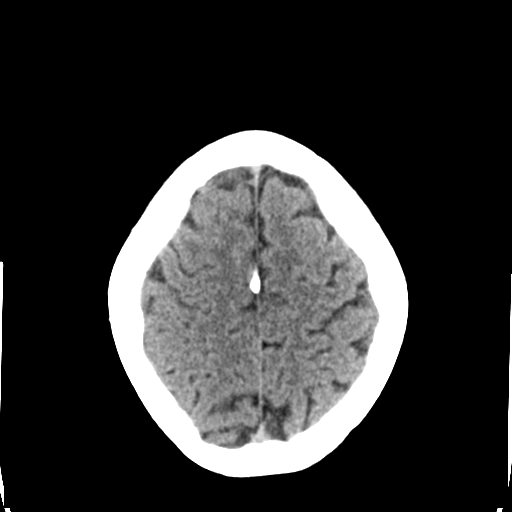
[im 24/32  bone]
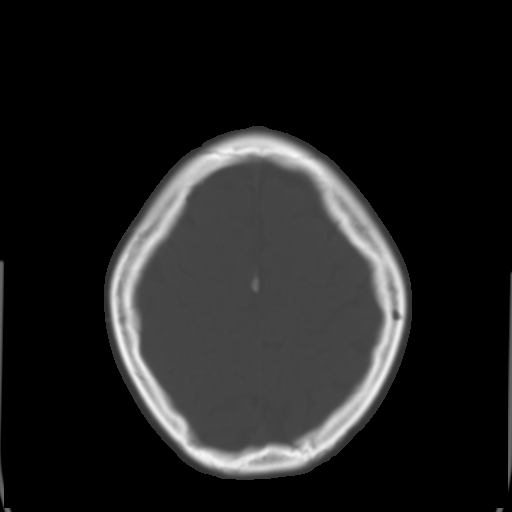
[im 26/32  brain]
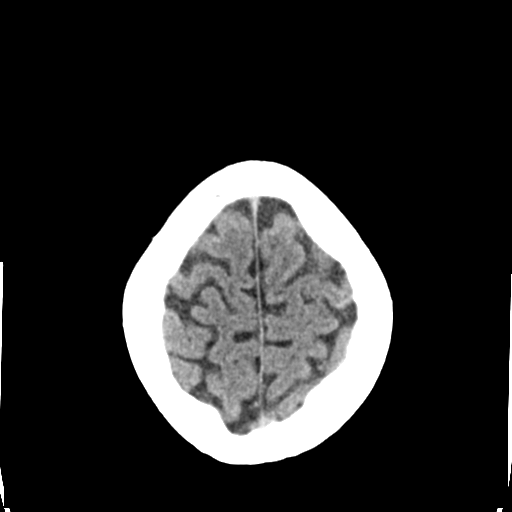
[im 28/32  brain]
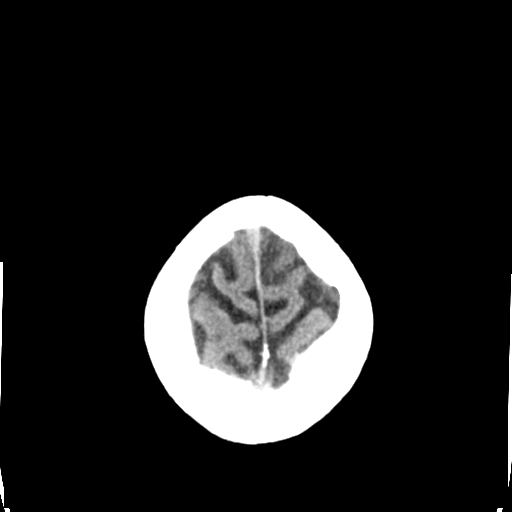
[im 30/32  brain]
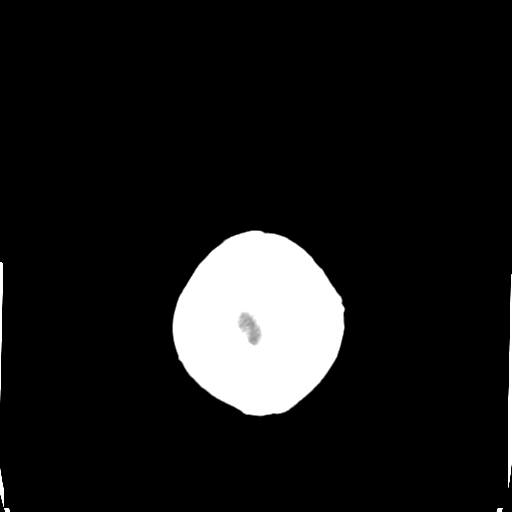

[16 of 30 positions shown; findings below may reference images not displayed]

FINDINGS: Skull and Sinuses:No significant abnormality.

Orbits: No acute abnormality.

Brain: No evidence of acute abnormality, such as acute infarction,
hemorrhage, hydrocephalus, or mass lesion/mass effect. There is a
cavum velum interpositum cyst or cavum vergae.
IMPRESSION: No evidence of acute intracranial trauma.

## 2014-11-09 ENCOUNTER — Ambulatory Visit: Payer: BC Managed Care – PPO | Admitting: Nurse Practitioner

## 2014-11-10 ENCOUNTER — Telehealth: Payer: Self-pay | Admitting: Neurology

## 2014-11-10 ENCOUNTER — Ambulatory Visit: Payer: BC Managed Care – PPO | Admitting: Neurology

## 2014-11-10 NOTE — Telephone Encounter (Signed)
Patient is a no show for today's appointment at 1:00 (11/10/14)

## 2015-01-24 ENCOUNTER — Ambulatory Visit: Payer: BC Managed Care – PPO | Admitting: Neurology

## 2015-08-24 ENCOUNTER — Emergency Department (HOSPITAL_COMMUNITY)
Admission: EM | Admit: 2015-08-24 | Discharge: 2015-08-24 | Disposition: A | Discharge status: Home / Self Care | Payer: BLUE CROSS/BLUE SHIELD | Attending: Physician Assistant | Admitting: Physician Assistant

## 2015-08-24 ENCOUNTER — Emergency Department (HOSPITAL_COMMUNITY): Payer: BLUE CROSS/BLUE SHIELD

## 2015-08-24 ENCOUNTER — Encounter (HOSPITAL_COMMUNITY): Payer: Self-pay | Admitting: Emergency Medicine

## 2015-08-24 DIAGNOSIS — R51 Headache: Secondary | ICD-10-CM | POA: Diagnosis not present

## 2015-08-24 DIAGNOSIS — R569 Unspecified convulsions: Secondary | ICD-10-CM

## 2015-08-24 DIAGNOSIS — Z862 Personal history of diseases of the blood and blood-forming organs and certain disorders involving the immune mechanism: Secondary | ICD-10-CM | POA: Insufficient documentation

## 2015-08-24 DIAGNOSIS — Z79899 Other long term (current) drug therapy: Secondary | ICD-10-CM | POA: Insufficient documentation

## 2015-08-24 DIAGNOSIS — Z3202 Encounter for pregnancy test, result negative: Secondary | ICD-10-CM | POA: Insufficient documentation

## 2015-08-24 LAB — COMPREHENSIVE METABOLIC PANEL
ALT: 17 U/L (ref 14–54)
AST: 20 U/L (ref 15–41)
Albumin: 3.5 g/dL (ref 3.5–5.0)
Alkaline Phosphatase: 65 U/L (ref 38–126)
Anion gap: 5 (ref 5–15)
BUN: 9 mg/dL (ref 6–20)
CO2: 24 mmol/L (ref 22–32)
Calcium: 9.2 mg/dL (ref 8.9–10.3)
Chloride: 108 mmol/L (ref 101–111)
Creatinine, Ser: 0.77 mg/dL (ref 0.44–1.00)
GFR calc Af Amer: >60 mL/min (ref >60)
GFR calc non Af Amer: >60 mL/min (ref >60)
GLUCOSE: 94 mg/dL (ref 65–99)
POTASSIUM: 3.9 mmol/L (ref 3.5–5.1)
Sodium: 137 mmol/L (ref 135–145)
Total Bilirubin: 0.4 mg/dL (ref 0.3–1.2)
Total Protein: 7.1 g/dL (ref 6.5–8.1)

## 2015-08-24 LAB — CBC WITH DIFFERENTIAL/PLATELET
Basophils Absolute: 0 10*3/uL (ref 0.0–0.1)
Basophils Relative: 0 %
Eosinophils Absolute: 0.1 10*3/uL (ref 0.0–0.7)
Eosinophils Relative: 2 %
HCT: 38.3 % (ref 36.0–46.0)
Hemoglobin: 13.5 g/dL (ref 12.0–15.0)
LYMPHS PCT: 35 %
Lymphs Abs: 2.4 10*3/uL (ref 0.7–4.0)
MCH: 29.7 pg (ref 26.0–34.0)
MCHC: 35.2 g/dL (ref 30.0–36.0)
MCV: 84.2 fL (ref 78.0–100.0)
MONO ABS: 0.5 10*3/uL (ref 0.1–1.0)
Monocytes Relative: 7 %
Neutro Abs: 3.8 10*3/uL (ref 1.7–7.7)
Neutrophils Relative %: 56 %
Platelets: 319 10*3/uL (ref 150–400)
RBC: 4.55 MIL/uL (ref 3.87–5.11)
RDW: 12.1 % (ref 11.5–15.5)
WBC: 6.8 10*3/uL (ref 4.0–10.5)

## 2015-08-24 LAB — I-STAT BETA HCG BLOOD, ED (MC, WL, AP ONLY): I-stat hCG, quantitative: <5 m[IU]/mL (ref <5)
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
## 2015-08-24 NOTE — ED Notes (Signed)
Pt returned from CT °

## 2015-08-24 NOTE — ED Notes (Signed)
Pt is in stable condition upon d/c and ambulates from ED. 
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
## 2015-08-24 NOTE — Discharge Instructions (Signed)
We will need to follow up with your neurologist. Please do not drive until you follow up with your neurologist. Do not bathe small children or doing anything where it would be dangerous if you had a seizure.   Best of luck in school and please return with any concerns.  Seizure, Adult A seizure is abnormal electrical activity in the brain. Seizures usually last from 30 seconds to 2 minutes. There are various types of seizures. Before a seizure, you may have a warning sensation (aura) that a seizure is about to occur. An aura may include the following symptoms:   Fear or anxiety.  Nausea.  Feeling like the room is spinning (vertigo).  Vision changes, such as seeing flashing lights or spots. Common symptoms during a seizure include:  A change in attention or behavior (altered mental status).  Convulsions with rhythmic jerking movements.  Drooling.  Rapid eye movements.  Grunting.  Loss of bladder and bowel control.  Bitter taste in the mouth.  Tongue biting. After a seizure, you may feel confused and sleepy. You may also have an injury resulting from convulsions during the seizure. HOME CARE INSTRUCTIONS   If you are given medicines, take them exactly as prescribed by your health care provider.  Keep all follow-up appointments as directed by your health care provider.  Do not swim or drive or engage in risky activity during which a seizure could cause further injury to you or others until your health care provider says it is OK.  Get adequate rest.  Teach friends and family what to do if you have a seizure. They should:  Lay you on the ground to prevent a fall.  Put a cushion under your head.  Loosen any tight clothing around your neck.  Turn you on your side. If vomiting occurs, this helps keep your airway clear.  Stay with you until you recover.  Know whether or not you need emergency care. SEEK IMMEDIATE MEDICAL CARE IF:  The seizure lasts longer than 5  minutes.  The seizure is severe or you do not wake up immediately after the seizure.  You have an altered mental status after the seizure.  You are having more frequent or worsening seizures. Someone should drive you to the emergency department or call local emergency services (911 in [place] MAKE SURE YOU:  Understand these instructions.  Will watch your condition.  Will get help right away if you are not doing well or get worse.   This information is not intended to replace advice given to you by your health care provider. Make sure you discuss any questions you have with your health care provider.   Document Released: 09/27/2000 Document Revised: 10/21/2014 Document Reviewed: 05/12/2013 Elsevier Interactive Patient Education [employer]2016 [employer]vier Inc.

## 2015-08-29 ENCOUNTER — Other Ambulatory Visit: Payer: Self-pay | Admitting: Neurology

## 2015-08-29 DIAGNOSIS — G40409 Other generalized epilepsy and epileptic syndromes, not intractable, without status epilepticus: Secondary | ICD-10-CM

## 2015-09-11 ENCOUNTER — Ambulatory Visit (HOSPITAL_COMMUNITY): Payer: BLUE CROSS/BLUE SHIELD

## 2015-09-15 ENCOUNTER — Ambulatory Visit (HOSPITAL_COMMUNITY)
Admission: RE | Admit: 2015-09-15 | Discharge: 2015-09-15 | Disposition: A | Discharge status: Home / Self Care | Payer: BLUE CROSS/BLUE SHIELD | Source: Ambulatory Visit | Attending: Neurology | Admitting: Neurology

## 2015-09-15 DIAGNOSIS — G40409 Other generalized epilepsy and epileptic syndromes, not intractable, without status epilepticus: Secondary | ICD-10-CM | POA: Insufficient documentation

## 2015-09-15 DIAGNOSIS — G930 Cerebral cysts: Secondary | ICD-10-CM | POA: Insufficient documentation

## 2015-09-15 MED ORDER — GADOBENATE DIMEGLUMINE 529 MG/ML IV SOLN
20.0000 mL | Freq: Once | INTRAVENOUS | Status: AC | PRN
  Administered 2015-09-15: 18 mL via INTRAVENOUS

## 2016-11-08 IMAGING — CT CT HEAD W/O CM
2 series · 15 of 30 positions shown, 17 images · non-contrast
Comparison: 08/11/2013 brain MRI and 10/13/2013 head CT.

CLINICAL DATA: Seizure.  Known cavum velum interpositum cyst.

EXAM:
CT HEAD WITHOUT CONTRAST
TECHNIQUE: Contiguous axial images were obtained from the base of the skull
through the vertex without intravenous contrast.

[Series 2: head without · axial · non-contrast · 0.47mm/px · z∈[-128,-8]mm · 7 of 34 slices shown, 9 images]
[im 5/34  brain]
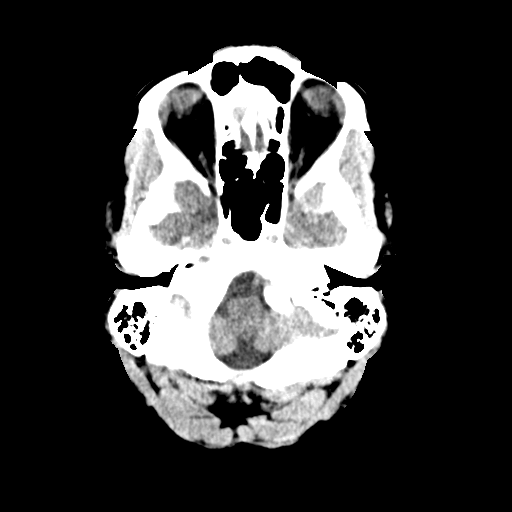
[im 5/34  bone]
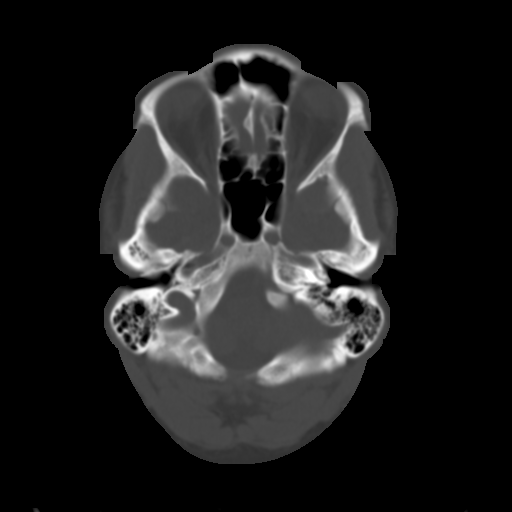
[im 9/34  brain]
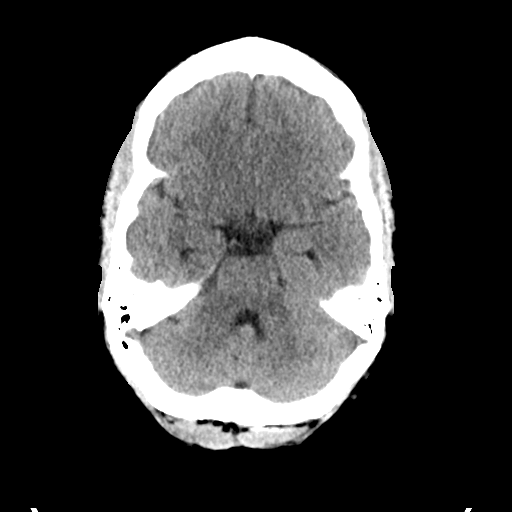
[im 13/34  brain]
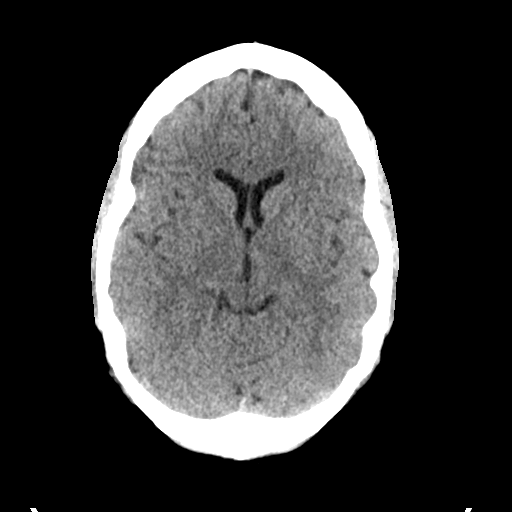
[im 17/34  brain]
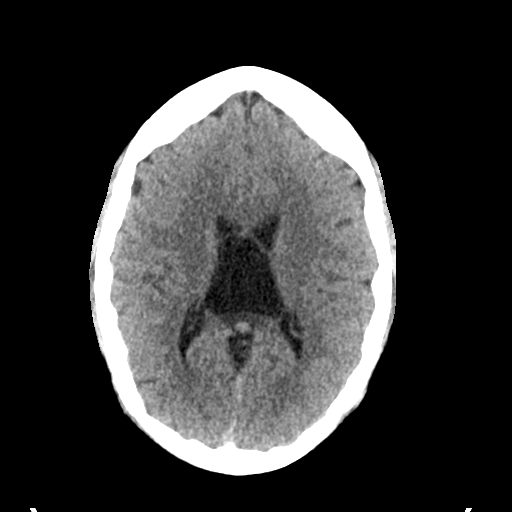
[im 21/34  brain]
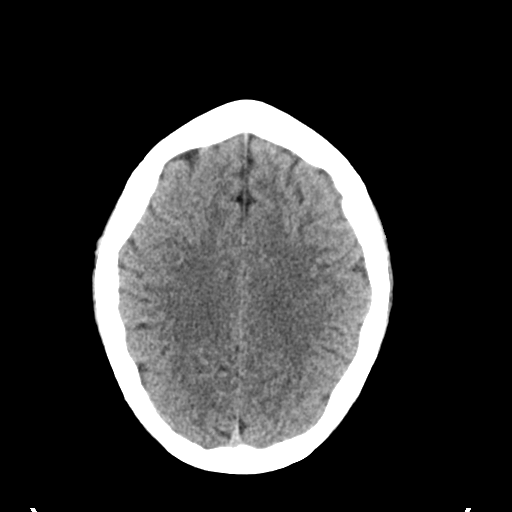
[im 21/34  bone]
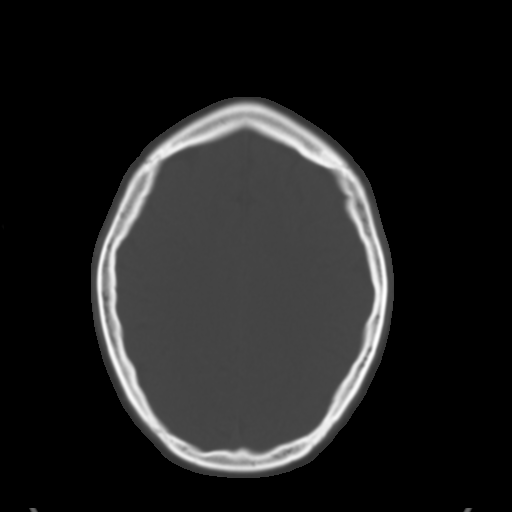
[im 25/34  brain]
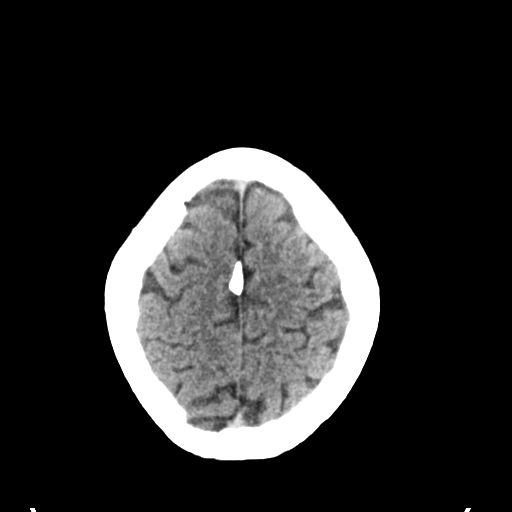
[im 29/34  brain]
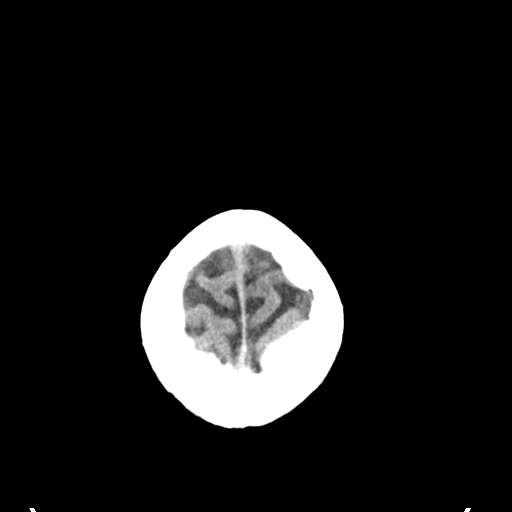

[Series 3: head bone · axial · 0.47mm/px · z∈[-132,-2]mm · 8 of 83 slices shown]
[im 9/83  bone]
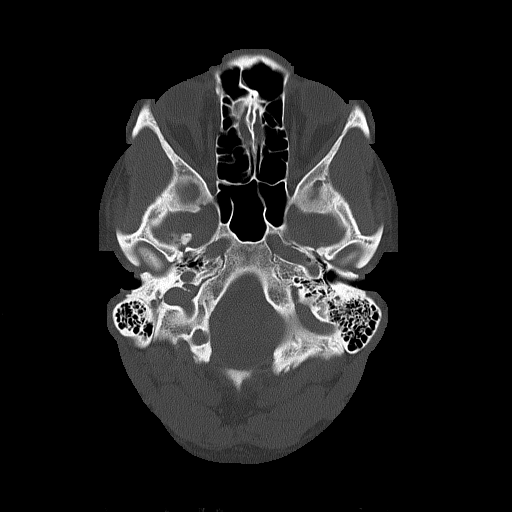
[im 17/83  bone]
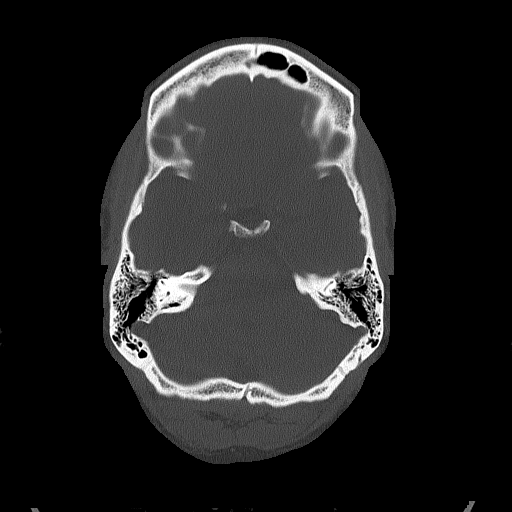
[im 25/83  bone]
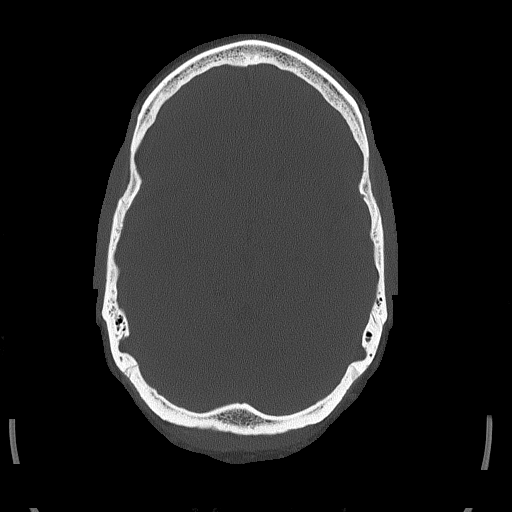
[im 37/83  bone]
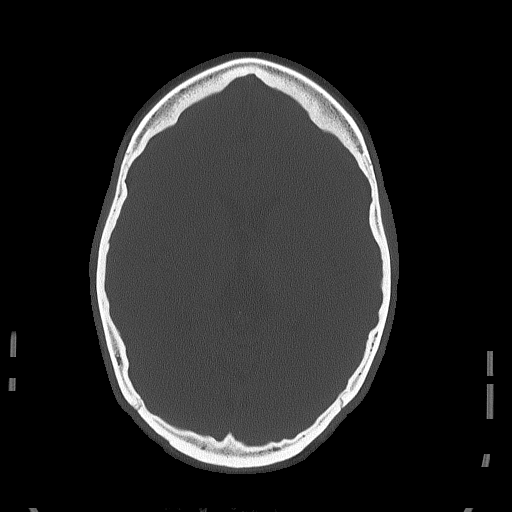
[im 46/83  bone]
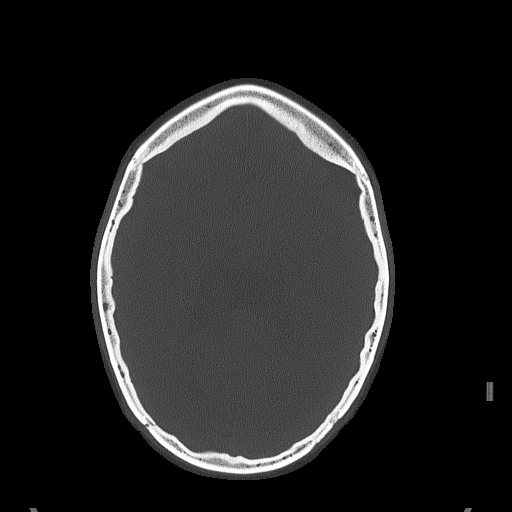
[im 58/83  bone]
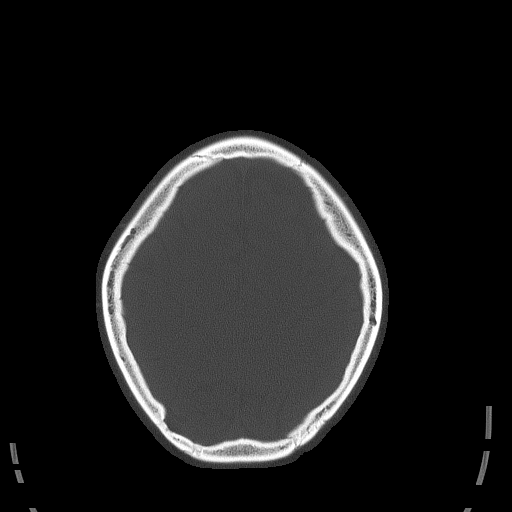
[im 66/83  bone]
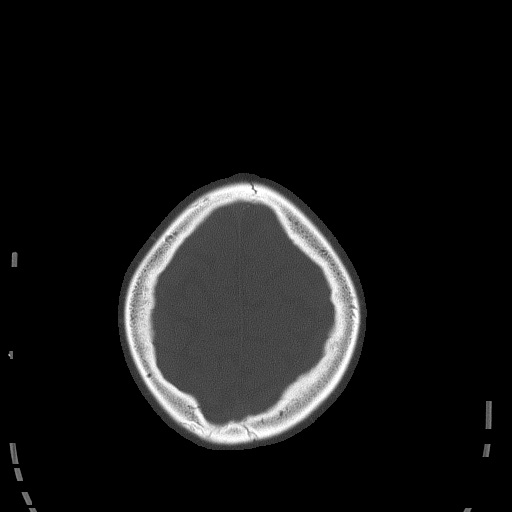
[im 74/83  bone]
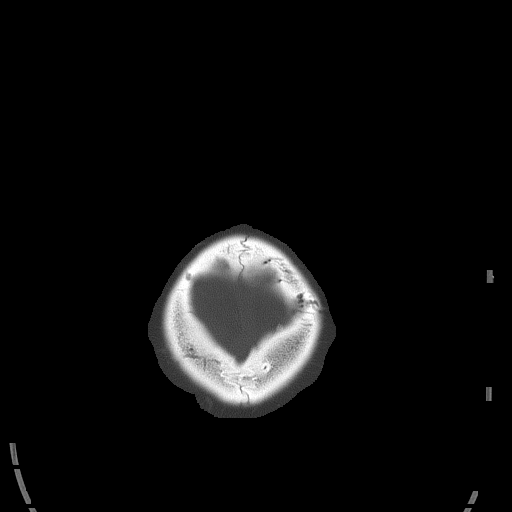

[15 of 30 positions shown; findings below may reference images not displayed]

FINDINGS: Thin-walled midline cavum velum interpositum cyst is unchanged in
size and appearance back to 08/08/13 head CT.

No evidence of parenchymal hemorrhage or extra-axial fluid
collection. No mass lesion, mass effect, or midline shift.

No CT evidence of acute infarction.

Cerebral volume is age appropriate. No ventriculomegaly.

The visualized paranasal sinuses are essentially clear. The mastoid
air cells are unopacified. No evidence of calvarial fracture.
IMPRESSION: 1.  No evidence of acute intracranial abnormality.
2. Thin-walled midline cavum velum interpositum cyst, stable for
over two years. No hydrocephalus.
